# Patient Record
Sex: Female | Born: 1990 | Race: Black or African American | Hispanic: No | Marital: Single | Smoking: Never smoker
Health system: Southern US, Community
[De-identification: ages and names within clinical notes are randomized; demographics above are authoritative.]

## PROBLEM LIST (undated history)

## (undated) HISTORY — PX: APPENDECTOMY: SHX54

---

## 2015-04-12 ENCOUNTER — Ambulatory Visit (INDEPENDENT_AMBULATORY_CARE_PROVIDER_SITE_OTHER): Payer: Self-pay | Admitting: Internal Medicine

## 2015-04-12 ENCOUNTER — Encounter: Payer: Self-pay | Admitting: Internal Medicine

## 2015-04-12 VITALS — BP 114/67 | HR 73 | Temp 98.0°F | Ht 63.5 in | Wt 183.2 lb

## 2015-04-12 DIAGNOSIS — Z23 Encounter for immunization: Secondary | ICD-10-CM

## 2015-04-12 DIAGNOSIS — Z0000 Encounter for general adult medical examination without abnormal findings: Secondary | ICD-10-CM | POA: Insufficient documentation

## 2015-04-12 DIAGNOSIS — E669 Obesity, unspecified: Secondary | ICD-10-CM

## 2015-04-12 DIAGNOSIS — Z6831 Body mass index (BMI) 31.0-31.9, adult: Secondary | ICD-10-CM

## 2015-04-12 NOTE — Assessment & Plan Note (Signed)
Discussed weight at length today. She says she has just started trying to lose weight with diet and exercise.  -May return at another time for visit for health and nutrition.

## 2015-04-12 NOTE — Assessment & Plan Note (Signed)
Patient presented for work physical. No significant complaints. Physical exam benign aside from being overweight.  -Given flu shot today. -Says she will return at another time for Pap smear and Tdap.  -RTC prn

## 2015-04-12 NOTE — Progress Notes (Signed)
   Subjective:   Patient ID: Tamara Hunter female   DOB: [birth year] 25 y.o.   MRN: [id number]  HPI: Ms. Tamara Hunter is a 25 y.o. female w/ no PMHx, presents to the clinic today for a work physical. Patient denies any significant health complaints today. Does not take any medications. Currently living in a house with her family (parents, brothers, sisters) in [place]. Works for an [employer]/product [employer] and helps with activities, meal time, etc. Denies alcohol use, does not smoke, or use recreational drugs. LMP was the end of January. Has regular periods. Not currently sexually active. Family history significant for DM type II in her father's side. Otherwise negative. Patient is overweight, is trying to diet and exercise. Accompanied by her mother today.   Has flea bites on legs, says this is related to a cat they had recently, no longer living with them, no longer have fleas.    No past medical history on file.   No current outpatient prescriptions on file.   No current facility-administered medications for this visit.    Review of Systems: General: Denies fever, chills, diaphoresis, appetite change and fatigue.  Respiratory: Denies SOB, DOE, cough, and wheezing.   Cardiovascular: Denies chest pain and palpitations.  Gastrointestinal: Denies nausea, vomiting, abdominal pain, and diarrhea.  Genitourinary: Denies dysuria, increased frequency, and flank pain. Endocrine: Denies hot or cold intolerance, polyuria, and polydipsia. Musculoskeletal: Denies myalgias, back pain, joint swelling, arthralgias and gait problem.  Skin: Denies pallor, rash and wounds.  Neurological: Denies dizziness, seizures, syncope, weakness, lightheadedness, numbness and headaches.  Psychiatric/Behavioral: Denies mood changes, and sleep disturbances.  Objective:   Physical Exam: Filed Vitals:   04/12/15 0946  BP: 114/67  Pulse: 73  Temp: 98 F (36.7 C)  TempSrc: Oral  Height: 5' 3.5" (1.613 m)  Weight: 183 lb  3.2 oz (83.099 kg)  SpO2: 100%    General: Overweight female, alert, cooperative, NAD. HEENT: PERRL, EOMI. Moist mucus membranes Neck: Full range of motion without pain, supple, no lymphadenopathy or carotid bruits Lungs: Clear to ascultation bilaterally, normal work of respiration, no wheezes, rales, rhonchi Heart: RRR, no murmurs, gallops, or rubs Abdomen: Soft, non-tender, non-distended, BS + Extremities: No cyanosis, clubbing, or edema. Small healing flea bites on legs.  Neurologic: Alert & oriented X3, cranial nerves II-XII intact, strength grossly intact, sensation intact to light touch   Assessment & Plan:   Please see problem based assessment and plan.

## 2015-04-12 NOTE — Patient Instructions (Signed)
Please make a follow up appointment AS NEEDED.    We would love for you to come back and get up to date in your vaccines (Tdap) and get a Pap smear.   If you have questions about weight loss and nutrition, we can arrange a visit with our nutritionist.    3. If you have worsening of your symptoms or new symptoms arise, please call the clinic ([phone]), or go to the ER immediately if symptoms are severe.   You have done a great job in taking all your medications. Please continue to do this.

## 2015-04-15 NOTE — Progress Notes (Signed)
Internal Medicine Clinic Attending  Case discussed with Dr. Jones at the time of the visit.  We reviewed the resident's history and exam and pertinent patient test results.  I agree with the assessment, diagnosis, and plan of care documented in the resident's note.  

## 2016-07-03 ENCOUNTER — Emergency Department (HOSPITAL_COMMUNITY): Payer: Self-pay

## 2016-07-03 ENCOUNTER — Encounter (HOSPITAL_COMMUNITY): Payer: Self-pay

## 2016-07-03 ENCOUNTER — Observation Stay (HOSPITAL_COMMUNITY)
Admission: EM | Admit: 2016-07-03 | Discharge: 2016-07-05 | Disposition: A | Discharge status: Home / Self Care | Payer: Self-pay | Attending: Obstetrics & Gynecology | Admitting: Obstetrics & Gynecology

## 2016-07-03 DIAGNOSIS — N921 Excessive and frequent menstruation with irregular cycle: Principal | ICD-10-CM | POA: Diagnosis present

## 2016-07-03 DIAGNOSIS — R55 Syncope and collapse: Secondary | ICD-10-CM

## 2016-07-03 DIAGNOSIS — D649 Anemia, unspecified: Secondary | ICD-10-CM

## 2016-07-03 DIAGNOSIS — N938 Other specified abnormal uterine and vaginal bleeding: Secondary | ICD-10-CM

## 2016-07-03 DIAGNOSIS — N939 Abnormal uterine and vaginal bleeding, unspecified: Secondary | ICD-10-CM

## 2016-07-03 DIAGNOSIS — D62 Acute posthemorrhagic anemia: Secondary | ICD-10-CM | POA: Insufficient documentation

## 2016-07-03 DIAGNOSIS — R1030 Lower abdominal pain, unspecified: Secondary | ICD-10-CM | POA: Insufficient documentation

## 2016-07-03 LAB — CBC
HEMATOCRIT: 22.8 % — AB (ref 36.0–46.0)
HEMOGLOBIN: 7.1 g/dL — AB (ref 12.0–15.0)
MCH: 21.6 pg — ABNORMAL LOW (ref 26.0–34.0)
MCHC: 31.1 g/dL (ref 30.0–36.0)
MCV: 69.5 fL — AB (ref 78.0–100.0)
Platelets: 501 10*3/uL — ABNORMAL HIGH (ref 150–400)
RBC: 3.28 MIL/uL — ABNORMAL LOW (ref 3.87–5.11)
RDW: 18.1 % — AB (ref 11.5–15.5)
WBC: 10.3 10*3/uL (ref 4.0–10.5)

## 2016-07-03 LAB — BASIC METABOLIC PANEL
ANION GAP: 9 (ref 5–15)
BUN: 11 mg/dL (ref 6–20)
CHLORIDE: 106 mmol/L (ref 101–111)
CO2: 24 mmol/L (ref 22–32)
Calcium: 9.3 mg/dL (ref 8.9–10.3)
Creatinine, Ser: 0.77 mg/dL (ref 0.44–1.00)
GFR calc Af Amer: >60 mL/min (ref >60)
GFR calc non Af Amer: >60 mL/min (ref >60)
GLUCOSE: 109 mg/dL — AB (ref 65–99)
POTASSIUM: 4 mmol/L (ref 3.5–5.1)
Sodium: 139 mmol/L (ref 135–145)

## 2016-07-03 LAB — HEPATIC FUNCTION PANEL
ALBUMIN: 3.4 g/dL — AB (ref 3.5–5.0)
ALT: 12 U/L — ABNORMAL LOW (ref 14–54)
AST: 18 U/L (ref 15–41)
Alkaline Phosphatase: 44 U/L (ref 38–126)
Bilirubin, Direct: <0.1 mg/dL — ABNORMAL LOW (ref 0.1–0.5)
TOTAL PROTEIN: 6.2 g/dL — AB (ref 6.5–8.1)
Total Bilirubin: <0.1 mg/dL — ABNORMAL LOW (ref 0.3–1.2)

## 2016-07-03 LAB — I-STAT BETA HCG BLOOD, ED (MC, WL, AP ONLY)

## 2016-07-03 LAB — PREPARE RBC (CROSSMATCH)

## 2016-07-03 LAB — ABO/RH: ABO/RH(D): O POS

## 2016-07-03 MED ORDER — SODIUM CHLORIDE 0.9 % IV SOLN: Freq: Once | INTRAVENOUS | Status: DC

## 2016-07-03 MED ORDER — ESTROGENS CONJUGATED 25 MG IJ SOLR
25.0000 mg | Freq: Once | INTRAMUSCULAR | Status: AC
  Administered 2016-07-03: 25 mg via INTRAVENOUS
  Filled 2016-07-03 (×2): qty 25

## 2016-07-03 MED ORDER — SODIUM CHLORIDE 0.9 % IV BOLUS (SEPSIS)
1000.0000 mL | Freq: Once | INTRAVENOUS | Status: AC
  Administered 2016-07-03: 1000 mL via INTRAVENOUS

## 2016-07-03 MED ORDER — SODIUM CHLORIDE 0.9 % IV SOLN
10.0000 mL/h | Freq: Once | INTRAVENOUS | Status: AC
  Administered 2016-07-03: 10 mL/h via INTRAVENOUS

## 2016-07-03 MED ORDER — STERILE WATER FOR INJECTION IJ SOLN
INTRAMUSCULAR | Status: AC
  Administered 2016-07-03: 23:00:00
  Filled 2016-07-03: qty 10

## 2016-07-03 NOTE — Progress Notes (Signed)
[facility] to [facility]. According to report by [facility], she had 2 units of PRBC's in [facility]. Awaiting on new orders. Will keep monitoring.

## 2016-07-03 NOTE — ED Provider Notes (Signed)
[facility]-EM[facility]ENCY DEPT Provider Note   CSN: [id number] Arrival date & time: 07/03/16  1457     History   Chief Complaint Chief Complaint  Patient presents with  . Menstrual Problem    HPI Tamara Hunter is a 26 y.o. female.  The history is provided by the patient. The history is limited by the condition of the patient.  Vaginal Bleeding  Primary symptoms include vaginal bleeding.  Primary symptoms include no discharge. There has been no fever. This is a new problem. Episode onset: had 22 days straight of heavy vaginal bleeding followed by 7 days of no bleeding, followed by another 17 days of heavy bleeding that is starting to taper off. The problem has not changed since onset.The symptoms occur spontaneously. Pregnant now: pt denies. She has not missed her period. Her LMP is unknown. The discharge was normal. Associated symptoms include abdominal pain (lower abdominal cramping), nausea, light-headedness and dizziness. Associated symptoms comments: Syncopal episode in the waiting room <2 min with n/v. She has tried nothing for the symptoms. The treatment provided no relief. Sexual activity: non-contributory.    History reviewed. No pertinent past medical history.  Patient Active Problem List   Diagnosis Date Noted  . Healthcare maintenance 04/12/2015  . Obesity 04/12/2015    Past Surgical History:  Procedure Laterality Date  . APPENDECTOMY      OB History    No data available       Home Medications    Prior to Admission medications   Not on File    Family History No family history on file.  Social History Social History  Substance Use Topics  . Smoking status: Never Smoker  . Smokeless tobacco: Never Used  . Alcohol use 0.0 oz/week     Allergies   Patient has no known allergies.   Review of Systems Review of Systems  Constitutional: Negative for fever.  HENT: Negative.   Respiratory: Negative for cough and shortness of breath.   Cardiovascular: Negative  for chest pain.  Gastrointestinal: Positive for abdominal pain (lower abdominal cramping) and nausea.  Genitourinary: Positive for vaginal bleeding.  Neurological: Positive for dizziness and light-headedness.  All other systems reviewed and are negative.    Physical Exam Updated Vital Signs BP 136/78 (BP Location: Left Arm)   Pulse (!) 128   Temp 98.8 F (37.1 C) (Oral)   Resp (!) 21   Ht 5\' 3"  (1.6 m)   SpO2 100%   Physical Exam  Constitutional: She is oriented to person, place, and time. She appears well-developed and well-nourished. No distress.  HENT:  Head: Normocephalic and atraumatic.  Dry mucous membranes  Eyes: EOM are normal. Pupils are equal, round, and reactive to light.  Neck: Normal range of motion. Neck supple.  Cardiovascular: Regular rhythm, normal heart sounds and intact distal pulses.   No murmur heard. tachy  Pulmonary/Chest: Effort normal and breath sounds normal. No respiratory distress.  Abdominal: Soft. She exhibits no distension. There is tenderness (mild lower abdominal tenderness). There is no guarding.  Genitourinary:  Genitourinary Comments: Vaginal bleeding present from os  Musculoskeletal: Normal range of motion. She exhibits no edema or tenderness.  Neurological: She is alert and oriented to person, place, and time. No cranial nerve deficit. She exhibits normal muscle tone. Coordination normal.  Skin: She is not diaphoretic. No pallor.  Psychiatric: She has a normal mood and affect.  Nursing note and vitals reviewed.    ED Treatments / Results  Labs (all labs ordered are  listed, but only abnormal results are displayed) Labs Reviewed  BASIC METABOLIC PANEL  CBC    EKG  EKG Interpretation None       Radiology No results found.  Procedures Procedures (including critical care time)  Medications Ordered in ED Medications - No data to display   Initial Impression / Assessment and Plan / ED Course  I have reviewed the triage  vital signs and the nursing notes.  Pertinent labs & imaging results that were available during my care of the patient were reviewed by me and considered in my medical decision making (see chart for details).     Patient is a 26 year old female who presents with prolonged vaginal bleeding as well as worsening lightheadedness and fatigue. She did have a syncopal episode in the waiting room. Upon arrival she is tachycardic and appears pale. She does have adequate blood pressure. Exam otherwise unremarkable. Pelvic exam with continual vaginal bleeding. Blood work obtained which shows anemia with a hemoglobin in the 7s. Given her syncope as well as tachycardia will arrange to administer blood. Discussed case with OB/GYN recommends transfer to [facility] as well as obtaining pelvic ultrasound given a dose of Premarin here. She is not preg.   Final Clinical Impressions(s) / ED Diagnoses   Final diagnoses:  None    New Prescriptions New Prescriptions   No medications on file     Marijean Niemann, MD 07/04/16 1553    Loren Racer, MD 07/08/16 1550

## 2016-07-03 NOTE — ED Triage Notes (Signed)
Per Pt, Pt is coming from home with complaints of period that has been persistent for seventeen days. Reports her period was continuous for 22 days, stopped for one week and then returned. Reports that she has had a headache x2 weeks and has started to feel light headed. HR noted to be 120s upon assessment.

## 2016-07-03 NOTE — ED Notes (Signed)
ED Provider at bedside. 

## 2016-07-03 NOTE — ED Notes (Signed)
Pt had syncopal episode while in lobby. Pt remained in wheelchair, did not fall out of her wheelchair. Appears very pale and followed by n/v.

## 2016-07-03 NOTE — ED Notes (Signed)
Patient transported to Ultrasound 

## 2016-07-04 ENCOUNTER — Encounter (HOSPITAL_COMMUNITY): Payer: Self-pay

## 2016-07-04 DIAGNOSIS — D649 Anemia, unspecified: Secondary | ICD-10-CM

## 2016-07-04 DIAGNOSIS — R55 Syncope and collapse: Secondary | ICD-10-CM

## 2016-07-04 DIAGNOSIS — N921 Excessive and frequent menstruation with irregular cycle: Secondary | ICD-10-CM

## 2016-07-04 DIAGNOSIS — D500 Iron deficiency anemia secondary to blood loss (chronic): Secondary | ICD-10-CM

## 2016-07-04 DIAGNOSIS — N938 Other specified abnormal uterine and vaginal bleeding: Secondary | ICD-10-CM

## 2016-07-04 LAB — TYPE AND SCREEN
ABO/RH(D): O POS
ABO/RH(D): O POS
Antibody Screen: NEGATIVE
Antibody Screen: NEGATIVE
UNIT DIVISION: 0
UNIT DIVISION: 0

## 2016-07-04 LAB — CBC
HCT: 26.6 % — ABNORMAL LOW (ref 36.0–46.0)
Hemoglobin: 8.5 g/dL — ABNORMAL LOW (ref 12.0–15.0)
MCH: 23.5 pg — ABNORMAL LOW (ref 26.0–34.0)
MCHC: 32 g/dL (ref 30.0–36.0)
MCV: 73.7 fL — ABNORMAL LOW (ref 78.0–100.0)
Platelets: 383 K/uL (ref 150–400)
RBC: 3.61 MIL/uL — ABNORMAL LOW (ref 3.87–5.11)
RDW: 17.5 % — ABNORMAL HIGH (ref 11.5–15.5)
WBC: 15.2 K/uL — ABNORMAL HIGH (ref 4.0–10.5)

## 2016-07-04 LAB — BPAM RBC
Blood Product Expiration Date: 201806132359
Blood Product Expiration Date: 201806132359
ISSUE DATE / TIME: 201805181831
ISSUE DATE / TIME: 201805182115
UNIT TYPE AND RH: 5100
Unit Type and Rh: 5100

## 2016-07-04 LAB — ABO/RH: ABO/RH(D): O POS

## 2016-07-04 MED ORDER — KCL IN DEXTROSE-NACL 20-5-0.45 MEQ/L-%-% IV SOLN
INTRAVENOUS | Status: DC
  Administered 2016-07-04: 01:00:00 via INTRAVENOUS
  Filled 2016-07-04 (×2): qty 1000

## 2016-07-04 MED ORDER — ESTROGENS CONJUGATED 25 MG IJ SOLR
25.0000 mg | Freq: Four times a day (QID) | INTRAMUSCULAR | Status: AC
  Administered 2016-07-04 (×2): 25 mg via INTRAVENOUS
  Filled 2016-07-04 (×2): qty 25

## 2016-07-04 MED ORDER — SODIUM CHLORIDE 0.9% FLUSH
3.0000 mL | Freq: Two times a day (BID) | INTRAVENOUS | Status: DC
  Administered 2016-07-04 (×2): 3 mL via INTRAVENOUS

## 2016-07-04 MED ORDER — FERUMOXYTOL INJECTION 510 MG/17 ML
510.0000 mg | Freq: Once | INTRAVENOUS | Status: AC
  Administered 2016-07-04: 510 mg via INTRAVENOUS
  Filled 2016-07-04: qty 17

## 2016-07-04 MED ORDER — MEGESTROL ACETATE 40 MG PO TABS
120.0000 mg | ORAL_TABLET | Freq: Every day | ORAL | Status: DC
  Administered 2016-07-04 – 2016-07-05 (×3): 120 mg via ORAL
  Filled 2016-07-04 (×4): qty 3

## 2016-07-04 NOTE — Progress Notes (Signed)
Subjective: Patient reports minimal bleeding continues.    Objective: I have reviewed patient's vital signs, intake and output, medications, labs and radiology results.  General: alert, cooperative and no distress GI: soft, non-tender; bowel sounds normal; no masses,  no organomegaly  Results for orders placed or performed during the hospital encounter of 07/03/16 (from the past 24 hour(s))  Basic metabolic panel     Status: Abnormal   Collection Time: 07/03/16  3:23 PM  Result Value Ref Range   Sodium 139 135 - 145 mmol/L   Potassium 4.0 3.5 - 5.1 mmol/L   Chloride 106 101 - 111 mmol/L   CO2 24 22 - 32 mmol/L   Glucose, Bld 109 (H) 65 - 99 mg/dL   BUN 11 6 - 20 mg/dL   Creatinine, Ser 1.61 0.44 - 1.00 mg/dL   Calcium 9.3 8.9 - 09.6 mg/dL   GFR calc non Af Amer >60 >60 mL/min   GFR calc Af Amer >60 >60 mL/min   Anion gap 9 5 - 15  CBC     Status: Abnormal   Collection Time: 07/03/16  3:23 PM  Result Value Ref Range   WBC 10.3 4.0 - 10.5 K/uL   RBC 3.28 (L) 3.87 - 5.11 MIL/uL   Hemoglobin 7.1 (L) 12.0 - 15.0 g/dL   HCT 04.5 (L) 40.9 - 81.1 %   MCV 69.5 (L) 78.0 - 100.0 fL   MCH 21.6 (L) 26.0 - 34.0 pg   MCHC 31.1 30.0 - 36.0 g/dL   RDW 91.4 (H) 78.2 - 95.6 %   Platelets 501 (H) 150 - 400 K/uL  Hepatic function panel     Status: Abnormal   Collection Time: 07/03/16  5:14 PM  Result Value Ref Range   Total Protein 6.2 (L) 6.5 - 8.1 g/dL   Albumin 3.4 (L) 3.5 - 5.0 g/dL   AST 18 15 - 41 U/L   ALT 12 (L) 14 - 54 U/L   Alkaline Phosphatase 44 38 - 126 U/L   Total Bilirubin <0.1 (L) 0.3 - 1.2 mg/dL   Bilirubin, Direct <2.1 (L) 0.1 - 0.5 mg/dL   Indirect Bilirubin NOT CALCULATED 0.3 - 0.9 mg/dL  Type and screen [facility]     Status: None   Collection Time: 07/03/16  5:14 PM  Result Value Ref Range   ABO/RH(D) O POS    Antibody Screen NEG    Sample Expiration 07/06/2016    Unit Number [id number]    Blood Component Type RED CELLS,LR    Unit division  00    Status of Unit ISSUED,FINAL    Transfusion Status OK TO TRANSFUSE    Crossmatch Result Compatible    Unit Number [id number]    Blood Component Type RBC LR PHER2    Unit division 00    Status of Unit ISSUED,FINAL    Transfusion Status OK TO TRANSFUSE    Crossmatch Result Compatible   Prepare RBC     Status: None   Collection Time: 07/03/16  5:14 PM  Result Value Ref Range   Order Confirmation ORDER PROCESSED BY BLOOD BANK DUPLICATE REQUEST   Prepare RBC     Status: None   Collection Time: 07/03/16  5:14 PM  Result Value Ref Range   Order Confirmation ORDER PROCESSED BY BLOOD BANK   ABO/Rh     Status: None   Collection Time: 07/03/16  5:14 PM  Result Value Ref Range   ABO/RH(D) O POS   I-Stat Beta  hCG blood, ED ([facility], [facility], [facility] only)     Status: None   Collection Time: 07/03/16  5:20 PM  Result Value Ref Range   I-stat hCG, quantitative <5.0 <5 mIU/mL   Comment 3          CBC     Status: Abnormal   Collection Time: 07/04/16  5:09 AM  Result Value Ref Range   WBC 15.2 (H) 4.0 - 10.5 K/uL   RBC 3.61 (L) 3.87 - 5.11 MIL/uL   Hemoglobin 8.5 (L) 12.0 - 15.0 g/dL   HCT 13.2 (L) 44.0 - 10.2 %   MCV 73.7 (L) 78.0 - 100.0 fL   MCH 23.5 (L) 26.0 - 34.0 pg   MCHC 32.0 30.0 - 36.0 g/dL   RDW 72.5 (H) 36.6 - 44.0 %   Platelets 383 150 - 400 K/uL  Type and screen [facility]     Status: None   Collection Time: 07/04/16  5:09 AM  Result Value Ref Range   ABO/RH(D) O POS    Antibody Screen NEG    Sample Expiration 07/07/2016    US Transvaginal Non-ob  Result Date: 07/03/2016 CLINICAL DATA:  Dysfunctional uterine bleeding. EXAM: TRANSABDOMINAL AND TRANSVAGINAL ULTRASOUND OF PELVIS TECHNIQUE: Both transabdominal and transvaginal ultrasound examinations of the pelvis were performed. Transabdominal technique was performed for global imaging of the pelvis including uterus, ovaries, adnexal regions, and pelvic cul-de-sac. It was necessary to proceed with endovaginal  exam following the transabdominal exam to visualize the endometrium and ovaries. COMPARISON:  None FINDINGS: Uterus Measurements: 4.4 x 5.8 x 6.8 cm. No fibroids or other mass visualized. Endometrium Thickness: 17.2 mm.  No focal abnormality visualized. Right ovary Measurements: 2.3 x 2.3 x 3.1 cm. Normal appearance/no adnexal mass. Normal color flow. Left ovary Measurements: 2.3 x 3.0 x 2.9 cm. Normal appearance/no adnexal mass. 2.6 cm dominant follicle. Normal color flow. Other findings Trace free pelvic fluid. IMPRESSION: Normal size uterus with slightly thickened endometrium, but no focal abnormality. Findings could be seen with hyperplasia, polyp or submucosal fibroid. Consider follow-up ultrasound in 6 weeks and GYN consultation if this persists. Electronically Signed   By: Elberta Fortis M.D.   On: 07/03/2016 20:59   US Pelvis Complete  Result Date: 07/03/2016 CLINICAL DATA:  Dysfunctional uterine bleeding. EXAM: TRANSABDOMINAL AND TRANSVAGINAL ULTRASOUND OF PELVIS TECHNIQUE: Both transabdominal and transvaginal ultrasound examinations of the pelvis were performed. Transabdominal technique was performed for global imaging of the pelvis including uterus, ovaries, adnexal regions, and pelvic cul-de-sac. It was necessary to proceed with endovaginal exam following the transabdominal exam to visualize the endometrium and ovaries. COMPARISON:  None FINDINGS: Uterus Measurements: 4.4 x 5.8 x 6.8 cm. No fibroids or other mass visualized. Endometrium Thickness: 17.2 mm.  No focal abnormality visualized. Right ovary Measurements: 2.3 x 2.3 x 3.1 cm. Normal appearance/no adnexal mass. Normal color flow. Left ovary Measurements: 2.3 x 3.0 x 2.9 cm. Normal appearance/no adnexal mass. 2.6 cm dominant follicle. Normal color flow. Other findings Trace free pelvic fluid. IMPRESSION: Normal size uterus with slightly thickened endometrium, but no focal abnormality. Findings could be seen with hyperplasia, polyp or submucosal  fibroid. Consider follow-up ultrasound in 6 weeks and GYN consultation if this persists. Electronically Signed   By: Elberta Fortis M.D.   On: 07/03/2016 20:59    Assessment/Plan: AUB: endometrial origin IV premarin + oral megestrol for acute endometrial stabilization Remain on megestrol for approximately 1 month then transition to OCP for longer term management  S/p  2 u PRBC and IV iron  LOS: 1 day    Tamara Hunter H 07/04/2016, 10:52 AM

## 2016-07-04 NOTE — H&P (Signed)
Preoperative History and Physical  Tamara Hunter is a  with No LMP recorded. admitted for a menometrorrhagia with symptomatic anemia.  Primary symptoms include vaginal bleeding.  Primary symptoms include no discharge. There has been no fever. This is a new problem. Episode onset: had 22 days straight of heavy vaginal bleeding followed by 7 days of no bleeding, followed by another 17 days of heavy bleeding that is starting to taper off. The problem has not changed since onset.The symptoms occur spontaneously. Pregnant now: pt denies. She has not missed her period. Her LMP is unknown. The discharge was normal. Associated symptoms include abdominal pain (lower abdominal cramping), nausea, light-headedness and dizziness. Associated symptoms comments: Syncopal episode in the waiting room <2 min with n/v. She has tried nothing for the symptoms. The treatment provided no relief. Sexual activity: non-contributory.   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  I will also supplement her with feraheme for her depleted iron stores   PMH:   History reviewed. No pertinent past medical history.  PSH:     Past Surgical History:  Procedure Laterality Date  . APPENDECTOMY      POb/GynH:      OB History    No data available      SH:   Social History  Substance Use Topics  . Smoking status: Never Smoker  . Smokeless tobacco: Never Used  . Alcohol use 0.0 oz/week    FH:   No family history on file.   Allergies: No Known Allergies  Medications:       Current Facility-Administered Medications:  .  conjugated estrogens (PREMARIN) injection 25 mg, 25 mg, Intravenous, Q6H, Eure, Luther H, MD .  dextrose 5 % and 0.45 % NaCl with KCl 20 mEq/L infusion, , Intravenous, Continuous, Eure, Amaryllis Dyke, MD .  megestrol (MEGACE) tablet 120 mg, 120 mg, Oral, Daily, Eure, Amaryllis Dyke, MD  Review of  Systems:   Review of Systems  Constitutional: Negative for fever, chills, weight loss, malaise/fatigue and diaphoresis.  HENT: Negative for hearing loss, ear pain, nosebleeds, congestion, sore throat, neck pain, tinnitus and ear discharge.   Eyes: Negative for blurred vision, double vision, photophobia, pain, discharge and redness.  Respiratory: Negative for cough, hemoptysis, sputum production, shortness of breath, wheezing and stridor.   Cardiovascular: Negative for chest pain, palpitations, orthopnea, claudication, leg swelling and PND.  Gastrointestinal: Positive for abdominal pain. Negative for heartburn, nausea, vomiting, diarrhea, constipation, blood in stool and melena.  Genitourinary: Negative for dysuria, urgency, frequency, hematuria and flank pain.  Musculoskeletal: Negative for myalgias, back pain, joint pain and falls.  Skin: Negative for itching and rash.  Neurological: Negative for dizziness, tingling, tremors, sensory change, speech change, focal weakness, seizures, loss of consciousness, weakness and headaches.  Endo/Heme/Allergies: Negative for environmental allergies and polydipsia. Does not bruise/bleed easily.  Psychiatric/Behavioral: Negative for depression, suicidal ideas, hallucinations, memory loss and substance abuse. The patient is not nervous/anxious and does not have insomnia.      PHYSICAL EXAM:  Blood pressure 113/64, pulse 90, temperature 98.2 F (36.8 C), temperature source Oral, resp. rate 18, height 5\' 3"  (1.6 m), SpO2 100 %.    Vitals reviewed. Constitutional: She is oriented to person, place, and time. She appears well-developed and well-nourished.  HENT:  Head: Normocephalic and atraumatic.  Right Ear: External ear normal.  Left Ear: External ear normal.  Nose: Nose  normal.  Mouth/Throat: Oropharynx is clear and moist.  Eyes: Conjunctivae and EOM are normal. Pupils are equal, round, and reactive to light. Right eye exhibits no discharge. Left eye  exhibits no discharge. No scleral icterus.  Neck: Normal range of motion. Neck supple. No tracheal deviation present. No thyromegaly present.  Cardiovascular: Normal rate, regular rhythm, normal heart sounds and intact distal pulses.  Exam reveals no gallop and no friction rub.   No murmur heard. Respiratory: Effort normal and breath sounds normal. No respiratory distress. She has no wheezes. She has no rales. She exhibits no tenderness.  GI: Soft. Bowel sounds are normal. She exhibits no distension and no mass. There is tenderness. There is no rebound and no guarding.  Genitourinary:       Vulva is normal without lesions Vagina is pink moist without discharge Cervix normal in appearance  Uterus is normal size, contour, position, consistency, mobility, non-tender Adnexa is negative with normal sized ovaries by sonogram  Musculoskeletal: Normal range of motion. She exhibits no edema and no tenderness.  Neurological: She is alert and oriented to person, place, and time. She has normal reflexes. She displays normal reflexes. No cranial nerve deficit. She exhibits normal muscle tone. Coordination normal.  Skin: Skin is warm and dry. No rash noted. No erythema. No pallor.  Psychiatric: She has a normal mood and affect. Her behavior is normal. Judgment and thought content normal.    Labs: Results for orders placed or performed during the hospital encounter of 07/03/16 (from the past 336 hour(s))  Basic metabolic panel   Collection Time: 07/03/16  3:23 PM  Result Value Ref Range   Sodium 139 135 - 145 mmol/L   Potassium 4.0 3.5 - 5.1 mmol/L   Chloride 106 101 - 111 mmol/L   CO2 24 22 - 32 mmol/L   Glucose, Bld 109 (H) 65 - 99 mg/dL   BUN 11 6 - 20 mg/dL   Creatinine, Ser 1.91 0.44 - 1.00 mg/dL   Calcium 9.3 8.9 - 47.8 mg/dL   GFR calc non Af Amer >60 >60 mL/min   GFR calc Af Amer >60 >60 mL/min   Anion gap 9 5 - 15  CBC   Collection Time: 07/03/16  3:23 PM  Result Value Ref Range   WBC  10.3 4.0 - 10.5 K/uL   RBC 3.28 (L) 3.87 - 5.11 MIL/uL   Hemoglobin 7.1 (L) 12.0 - 15.0 g/dL   HCT 29.5 (L) 62.1 - 30.8 %   MCV 69.5 (L) 78.0 - 100.0 fL   MCH 21.6 (L) 26.0 - 34.0 pg   MCHC 31.1 30.0 - 36.0 g/dL   RDW 65.7 (H) 84.6 - 96.2 %   Platelets 501 (H) 150 - 400 K/uL  Hepatic function panel   Collection Time: 07/03/16  5:14 PM  Result Value Ref Range   Total Protein 6.2 (L) 6.5 - 8.1 g/dL   Albumin 3.4 (L) 3.5 - 5.0 g/dL   AST 18 15 - 41 U/L   ALT 12 (L) 14 - 54 U/L   Alkaline Phosphatase 44 38 - 126 U/L   Total Bilirubin <0.1 (L) 0.3 - 1.2 mg/dL   Bilirubin, Direct <9.5 (L) 0.1 - 0.5 mg/dL   Indirect Bilirubin NOT CALCULATED 0.3 - 0.9 mg/dL  Type and screen [facility]   Collection Time: 07/03/16  5:14 PM  Result Value Ref Range   ABO/RH(D) O POS    Antibody Screen NEG    Sample Expiration 07/06/2016  Unit Number [id number]    Blood Component Type RED CELLS,LR    Unit division 00    Status of Unit ISSUED    Transfusion Status OK TO TRANSFUSE    Crossmatch Result Compatible    Unit Number [id number]    Blood Component Type RBC LR PHER2    Unit division 00    Status of Unit ISSUED    Transfusion Status OK TO TRANSFUSE    Crossmatch Result Compatible   Prepare RBC   Collection Time: 07/03/16  5:14 PM  Result Value Ref Range   Order Confirmation ORDER PROCESSED BY BLOOD BANK DUPLICATE REQUEST   Prepare RBC   Collection Time: 07/03/16  5:14 PM  Result Value Ref Range   Order Confirmation ORDER PROCESSED BY BLOOD BANK   ABO/Rh   Collection Time: 07/03/16  5:14 PM  Result Value Ref Range   ABO/RH(D) O POS   BPAM RBC   Collection Time: 07/03/16  5:14 PM  Result Value Ref Range   ISSUE DATE / TIME [id number]    Blood Product Unit Number [id number]    PRODUCT CODE [id number]    Unit Type and Rh 5100    Blood Product Expiration Date [id number]    ISSUE DATE / TIME [id number]    Blood Product Unit Number [id number]    PRODUCT  CODE [id number]    Unit Type and Rh 5100    Blood Product Expiration Date [id number]   I-Stat Beta hCG blood, ED ([facility], [facility], [facility] only)   Collection Time: 07/03/16  5:20 PM  Result Value Ref Range   I-stat hCG, quantitative <5.0 <5 mIU/mL   Comment 3            EKG: Orders placed or performed during the hospital encounter of 07/03/16  . EKG 12-Lead  . EKG 12-Lead    Imaging Studies: [place] Transvaginal Non-ob  Result Date: 07/03/2016 CLINICAL DATA:  Dysfunctional uterine bleeding. EXAM: TRANSABDOMINAL AND TRANSVAGINAL ULTRASOUND OF PELVIS TECHNIQUE: Both transabdominal and transvaginal ultrasound examinations of the pelvis were performed. Transabdominal technique was performed for global imaging of the pelvis including uterus, ovaries, adnexal regions, and pelvic cul-de-sac. It was necessary to proceed with endovaginal exam following the transabdominal exam to visualize the endometrium and ovaries. COMPARISON:  None FINDINGS: Uterus Measurements: 4.4 x 5.8 x 6.8 cm. No fibroids or other mass visualized. Endometrium Thickness: 17.2 mm.  No focal abnormality visualized. Right ovary Measurements: 2.3 x 2.3 x 3.1 cm. Normal appearance/no adnexal mass. Normal color flow. Left ovary Measurements: 2.3 x 3.0 x 2.9 cm. Normal appearance/no adnexal mass. 2.6 cm dominant follicle. Normal color flow. Other findings Trace free pelvic fluid. IMPRESSION: Normal size uterus with slightly thickened endometrium, but no focal abnormality. Findings could be seen with hyperplasia, polyp or submucosal fibroid. Consider follow-up ultrasound in 6 weeks and GYN consultation if this persists. Electronically Signed   By: Elberta Fortisaniel  Boyle M.D.   On: 07/03/2016 20:59   [place] Pelvis Complete  Result Date: 07/03/2016 CLINICAL DATA:  Dysfunctional uterine bleeding. EXAM: TRANSABDOMINAL AND TRANSVAGINAL ULTRASOUND OF PELVIS TECHNIQUE: Both transabdominal and transvaginal ultrasound examinations of the pelvis were performed. Transabdominal  technique was performed for global imaging of the pelvis including uterus, ovaries, adnexal regions, and pelvic cul-de-sac. It was necessary to proceed with endovaginal exam following the transabdominal exam to visualize the endometrium and ovaries. COMPARISON:  None FINDINGS: Uterus Measurements: 4.4 x 5.8 x 6.8 cm. No fibroids or other mass visualized. Endometrium Thickness: 17.2 mm.  No focal  abnormality visualized. Right ovary Measurements: 2.3 x 2.3 x 3.1 cm. Normal appearance/no adnexal mass. Normal color flow. Left ovary Measurements: 2.3 x 3.0 x 2.9 cm. Normal appearance/no adnexal mass. 2.6 cm dominant follicle. Normal color flow. Other findings Trace free pelvic fluid. IMPRESSION: Normal size uterus with slightly thickened endometrium, but no focal abnormality. Findings could be seen with hyperplasia, polyp or submucosal fibroid. Consider follow-up ultrasound in 6 weeks and GYN consultation if this persists. Electronically Signed   By: Elberta Fortis M.D.   On: 07/03/2016 20:59   My impression after viewing the sonogram images: endometrium is thickened but normal for this clinical scenario.  The endometrium is homogenous and without any complexity suggestive of a myoma or polyp.  Likewise there is no blood flow pattern to suggest either a polyp or myoma.  Hyperplasia in a 26 year old with heretofore regular periods, albeit heavy, would be very unlikely I do not think any pathologic evaluation of her endometrium is indicated on that basis alone   Assessment: Menometrorrhagia Anemia, iron deficiency, acute on chronic, symptomatic     Plan: Pt has transfused and given premarin Will add megestrol Check CBC in am  Give 2 more doses of premarin and possible discharge tomorrow on oral megestrol and to be chronically managed  EURE,LUTHER H 07/04/2016 12:03 AM

## 2016-07-05 DIAGNOSIS — N921 Excessive and frequent menstruation with irregular cycle: Secondary | ICD-10-CM | POA: Diagnosis present

## 2016-07-05 LAB — CBC
HEMATOCRIT: 26.5 % — AB (ref 36.0–46.0)
HEMOGLOBIN: 8.6 g/dL — AB (ref 12.0–15.0)
MCH: 24 pg — AB (ref 26.0–34.0)
MCHC: 32.5 g/dL (ref 30.0–36.0)
MCV: 74 fL — AB (ref 78.0–100.0)
PLATELETS: 395 10*3/uL (ref 150–400)
RBC: 3.58 MIL/uL — AB (ref 3.87–5.11)
RDW: 18.4 % — ABNORMAL HIGH (ref 11.5–15.5)
WBC: 9.7 10*3/uL (ref 4.0–10.5)

## 2016-07-05 MED ORDER — MEGESTROL ACETATE 40 MG PO TABS: 120.0000 mg | ORAL_TABLET | Freq: Every day | ORAL | 3 refills | Status: DC

## 2016-07-05 NOTE — Progress Notes (Signed)

## 2016-07-05 NOTE — Discharge Summary (Signed)
Physician Discharge Summary  Patient ID: Tamara SnareCovina Sia MRN: [id number] DOB/AGE: Oct 18, 1990 26 y.o.  Admit date: 07/03/2016 Discharge date: 07/05/2016  Admission Diagnoses: Active Problems:   Dysfunctional uterine bleeding   Symptomatic anemia   Syncope   Menometrorrhagia   Discharge Diagnoses:  Active Problems:   Dysfunctional uterine bleeding   Symptomatic anemia   Syncope   Menometrorrhagia   Discharged Condition: good  Hospital Course: transfusion 2 units PRBC, sonogram was normal, megestrol and IV premarin therapy  Discharged on 120 mg of megestrol daily  Consults: None  Significant Diagnostic Studies: labs:   Treatments: IV premarin + megestrol therapy  Discharge Exam: Blood pressure 106/64, pulse 78, temperature 98.1 F (36.7 C), temperature source Oral, resp. rate 20, height 5\' 3"  (1.6 m), weight 181 lb (82.1 kg), SpO2 100 %. General appearance: alert, cooperative and no distress GI: soft, non-tender; bowel sounds normal; no masses,  no organomegaly  Disposition: Final discharge disposition not confirmed  Discharge Instructions    Call MD for:    Complete by:  As directed    Excessive bleeding   Call MD for:  persistant nausea and vomiting    Complete by:  As directed    Call MD for:  severe uncontrolled pain    Complete by:  As directed    Call MD for:  temperature >100.4    Complete by:  As directed    Diet - low sodium heart healthy    Complete by:  As directed    Increase activity slowly    Complete by:  As directed      Allergies as of 07/05/2016   No Known Allergies     Medication List    TAKE these medications   acetaminophen 500 MG tablet Commonly known as:  TYLENOL Take 500 mg by mouth every 6 (six) hours as needed for mild pain.   megestrol 40 MG tablet Commonly known as:  MEGACE Take 3 tablets (120 mg total) by mouth daily. Start taking on:  07/06/2016      Follow-up Information    [facility] up [facility] 3  week(s).   Contact information: [address] [place] [id number] [phone]          Signed: Lazaro ArmsURE,Joselyn Edling H 07/05/2016, 12:14 PM

## 2016-07-05 NOTE — Discharge Instructions (Signed)

## 2016-08-11 ENCOUNTER — Ambulatory Visit (INDEPENDENT_AMBULATORY_CARE_PROVIDER_SITE_OTHER): Payer: Self-pay | Admitting: Obstetrics & Gynecology

## 2016-08-11 ENCOUNTER — Encounter: Payer: Self-pay | Admitting: Obstetrics & Gynecology

## 2016-08-11 VITALS — BP 124/83 | HR 98 | Ht 63.0 in | Wt 186.9 lb

## 2016-08-11 DIAGNOSIS — N921 Excessive and frequent menstruation with irregular cycle: Secondary | ICD-10-CM

## 2016-08-11 DIAGNOSIS — D500 Iron deficiency anemia secondary to blood loss (chronic): Secondary | ICD-10-CM

## 2016-08-11 MED ORDER — NORGESTREL-ETHINYL ESTRADIOL 0.3-30 MG-MCG PO TABS: 1.0000 | ORAL_TABLET | Freq: Every day | ORAL | 11 refills | Status: DC

## 2016-08-11 MED ORDER — IBUPROFEN 600 MG PO TABS: 600.0000 mg | ORAL_TABLET | Freq: Four times a day (QID) | ORAL | 1 refills | Status: AC | PRN

## 2016-08-11 NOTE — Progress Notes (Signed)
   Subjective:    Patient ID: Tamara Hunter, female    DOB: January 15, 1991, 26 y.o.   MRN: [id number]  HPI [age] yoS G0 here today for followup after hosp admission for symptomatic anemia and menorrhagia. This was 07-03-16. Her hbg was 7.1. She was transfused and given IV premarin and sent home on po megace. She has had severe cramps since she started a period. Her cramps with a normal period are less. Tylenol not helping.  Menarche at about [age]. She reports previously that she would skip period and then bled for several weeks. Other than that, her periods were normal once per month.   Review of Systems She is virginal. Unemployed, not in school. Has not had Gardasil.    Objective:   Physical Exam [id number], moderately obese Breathing, conversing, and ambulating normally Abd- benign     Assessment & Plan:  Rec Gardasil at the health dept. Menometrorrhagia- prescribed Lo ovral RTC 6 weeks for BP check and follow up Rec free pap clinic Cramps- prescribe IBU Check TSH

## 2016-08-12 LAB — TSH: TSH: 1.52 u[IU]/mL (ref 0.450–4.500)

## 2016-09-28 ENCOUNTER — Encounter: Payer: Self-pay | Admitting: Obstetrics & Gynecology

## 2016-09-28 ENCOUNTER — Ambulatory Visit (INDEPENDENT_AMBULATORY_CARE_PROVIDER_SITE_OTHER): Payer: Self-pay | Admitting: Obstetrics & Gynecology

## 2016-09-28 VITALS — BP 133/67 | HR 87 | Ht 63.0 in | Wt 187.8 lb

## 2016-09-28 DIAGNOSIS — D500 Iron deficiency anemia secondary to blood loss (chronic): Secondary | ICD-10-CM

## 2016-09-28 DIAGNOSIS — N946 Dysmenorrhea, unspecified: Secondary | ICD-10-CM

## 2016-09-28 DIAGNOSIS — N921 Excessive and frequent menstruation with irregular cycle: Secondary | ICD-10-CM

## 2016-09-28 NOTE — Progress Notes (Signed)
   Subjective:    Patient ID: Tamara Hunter, female    DOB: [birth year], 26 y.o.   MRN: [id number]  HPI 26 yo SG0 here for follow up after starting Lo ovral for menorrhagia resulting in anemia (hbg 7). She also has a h/o severe dysmenorrhea. Her period after starting the Lo ovral lasted 8 days instead of her usual 20 days. It was still painful.   Review of Systems     Objective:   Physical Exam Well nourished, well hydrated black female, no apparent distress Breathing, conversing, and ambulating normally Abd- benign      Assessment & Plan:  H/o anemia- check cbc RTC 2 months If pain no better, then rec laparoscopy We discussed endometriosis and laparoscopy

## 2016-09-29 LAB — CBC
HEMATOCRIT: 32.8 % — AB (ref 34.0–46.6)
Hemoglobin: 10.2 g/dL — ABNORMAL LOW (ref 11.1–15.9)
MCH: 23.8 pg — AB (ref 26.6–33.0)
MCHC: 31.1 g/dL — ABNORMAL LOW (ref 31.5–35.7)
MCV: 77 fL — AB (ref 79–97)
Platelets: 556 10*3/uL — ABNORMAL HIGH (ref 150–379)
RBC: 4.28 x10E6/uL (ref 3.77–5.28)
RDW: 17.1 % — AB (ref 12.3–15.4)
WBC: 8.2 10*3/uL (ref 3.4–10.8)

## 2016-11-30 ENCOUNTER — Ambulatory Visit: Payer: Self-pay | Admitting: Obstetrics & Gynecology

## 2016-12-07 ENCOUNTER — Ambulatory Visit: Payer: Self-pay | Admitting: Obstetrics & Gynecology

## 2016-12-14 ENCOUNTER — Encounter: Payer: Self-pay | Admitting: Obstetrics & Gynecology

## 2016-12-14 ENCOUNTER — Ambulatory Visit (INDEPENDENT_AMBULATORY_CARE_PROVIDER_SITE_OTHER): Payer: Self-pay | Admitting: Obstetrics & Gynecology

## 2016-12-14 VITALS — BP 127/67 | HR 77 | Wt 185.6 lb

## 2016-12-14 DIAGNOSIS — D500 Iron deficiency anemia secondary to blood loss (chronic): Secondary | ICD-10-CM

## 2016-12-14 NOTE — Progress Notes (Signed)
   Subjective:    Patient ID: Tamara Hunter, female    DOB: 1990/05/05, 26 y.o.   MRN: [id number]  HPI 26yo single AA P0 here for follow up after starting lo ovral. This was prescribed for DUB/cramping. She had a normal 4 days period last month. Pelvic pain has improved. She denies any problems at this point.   Review of Systems     Objective:   Physical Exam Breathing, conversing, and ambulating normally Well nourished, well hydrated Black female, no apparent distress      Assessment & Plan:  Preventative care- I gave her the phone number for the health dept so that she can get Gardasil and a pap smear. She has never had a pap smear.  Recheck cbc

## 2016-12-15 LAB — CBC
HEMOGLOBIN: 10.3 g/dL — AB (ref 11.1–15.9)
Hematocrit: 33.9 % — ABNORMAL LOW (ref 34.0–46.6)
MCH: 22.1 pg — AB (ref 26.6–33.0)
MCHC: 30.4 g/dL — AB (ref 31.5–35.7)
MCV: 73 fL — ABNORMAL LOW (ref 79–97)
Platelets: 487 10*3/uL — ABNORMAL HIGH (ref 150–379)
RBC: 4.66 x10E6/uL (ref 3.77–5.28)
RDW: 17.3 % — AB (ref 12.3–15.4)
WBC: 6.4 10*3/uL (ref 3.4–10.8)

## 2017-07-15 ENCOUNTER — Other Ambulatory Visit: Payer: Self-pay | Admitting: Obstetrics & Gynecology

## 2018-04-30 ENCOUNTER — Other Ambulatory Visit: Payer: Self-pay | Admitting: Obstetrics & Gynecology

## 2018-07-10 IMAGING — US US TRANSVAGINAL NON-OB
1 series · 14 of 25 positions shown · non-contrast
Comparison: None

CLINICAL DATA: Dysfunctional uterine bleeding.

EXAM:
TRANSABDOMINAL AND TRANSVAGINAL ULTRASOUND OF PELVIS
TECHNIQUE: Both transabdominal and transvaginal ultrasound examinations of the
pelvis were performed. Transabdominal technique was performed for
global imaging of the pelvis including uterus, ovaries, adnexal
regions, and pelvic cul-de-sac. It was necessary to proceed with
endovaginal exam following the transabdominal exam to visualize the
endometrium and ovaries.

[Series 1: us transvaginal non-ob · 0.27mm/px · 14 of 80 slices shown]
[im 1/80]
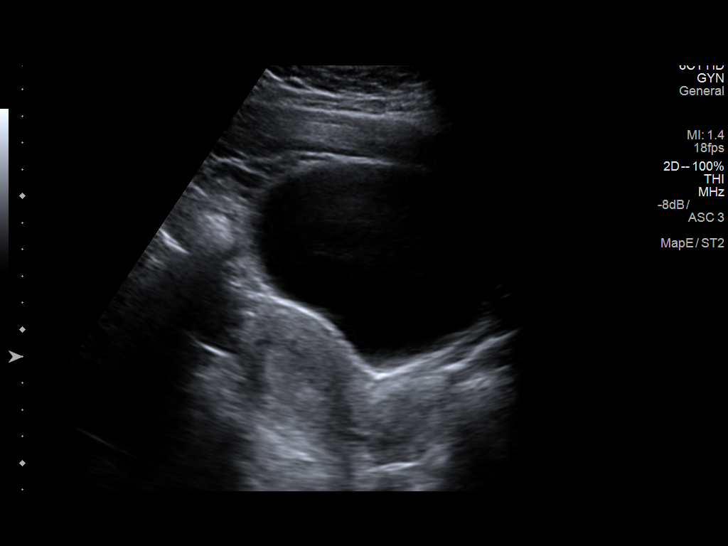
[im 7/80]
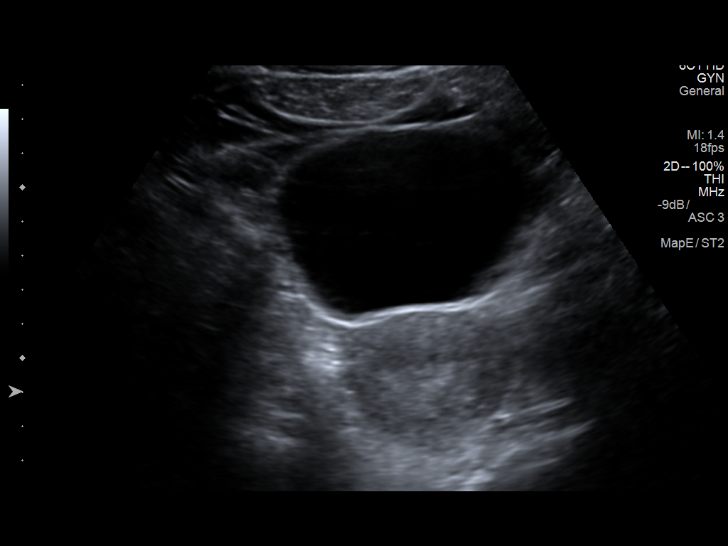
[im 14/80]
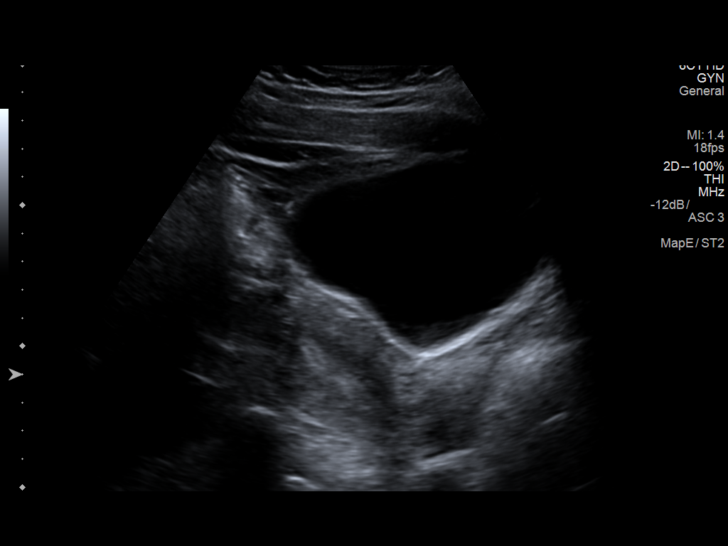
[im 20/80]
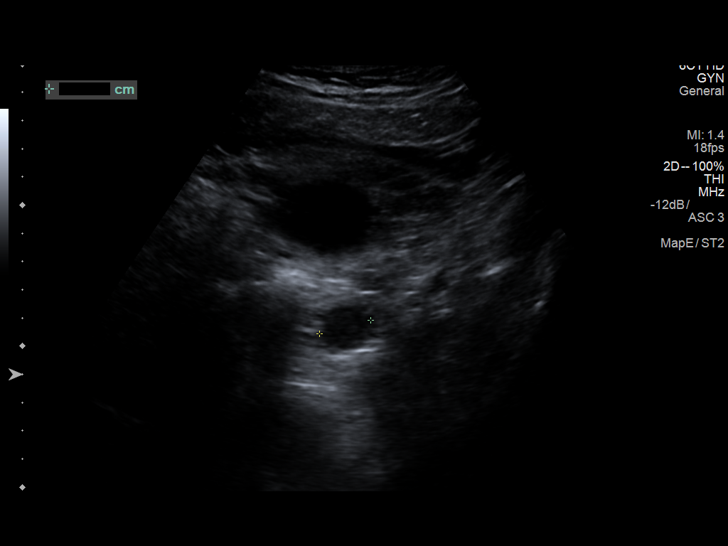
[im 27/80]
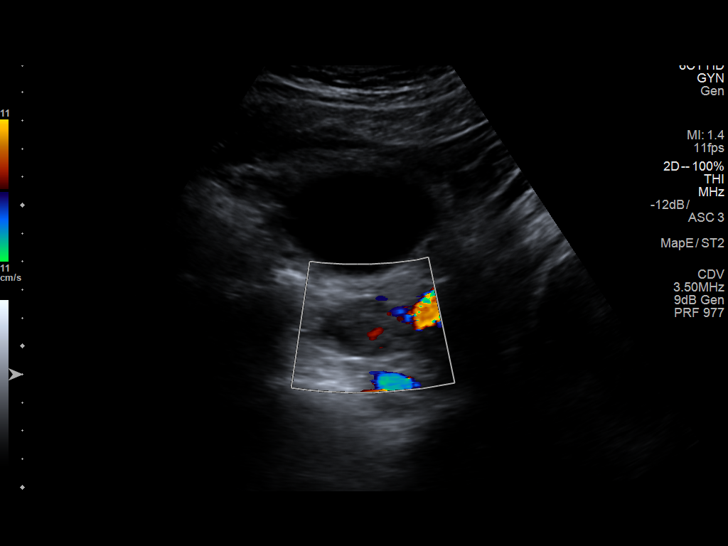
[im 30/80]
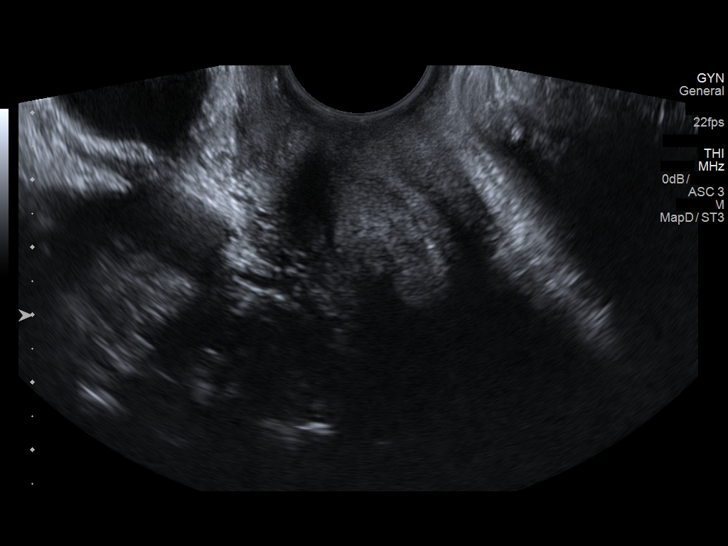
[im 37/80]
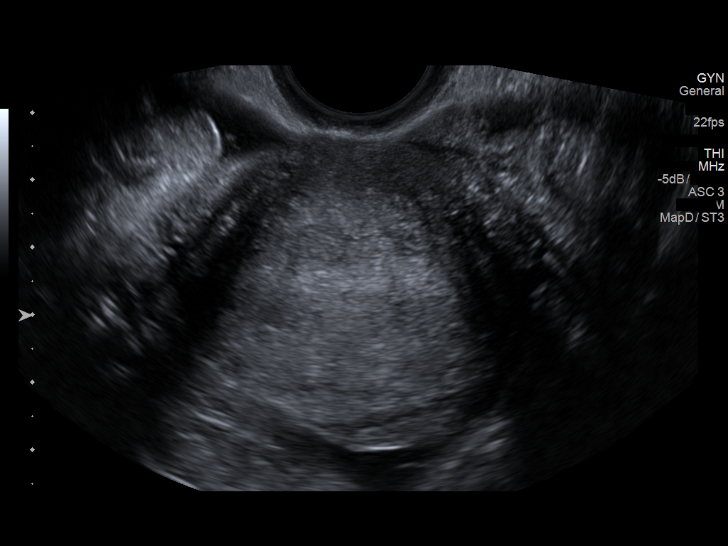
[im 43/80]
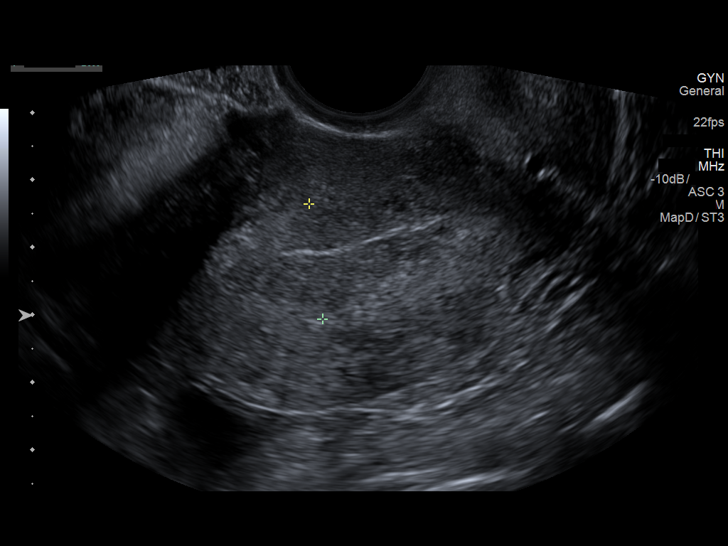
[im 50/80]
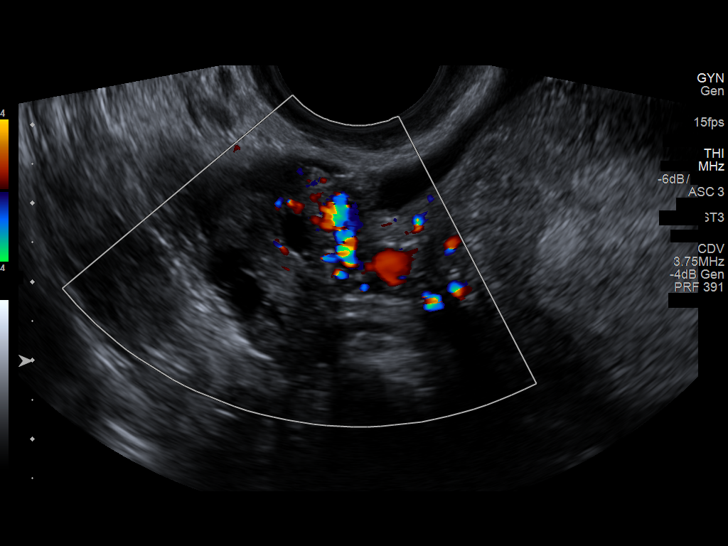
[im 53/80]
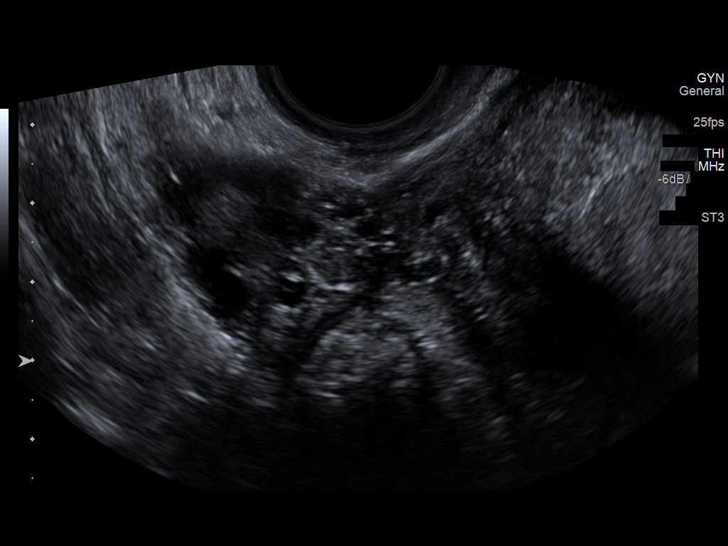
[im 60/80]
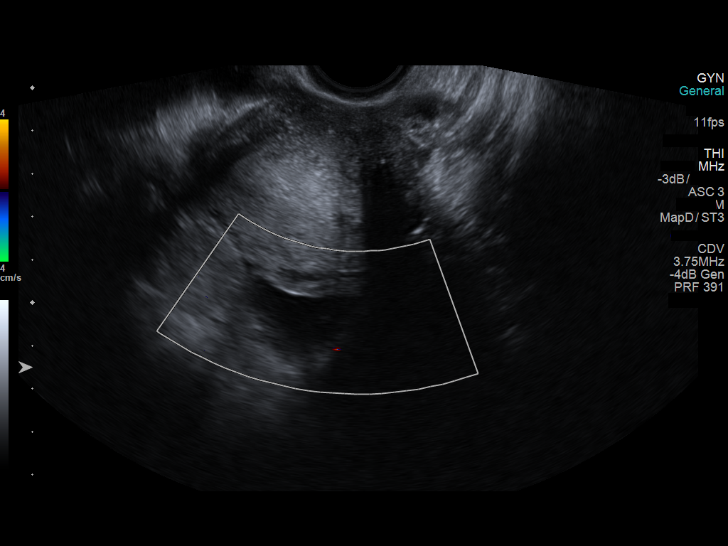
[im 66/80]
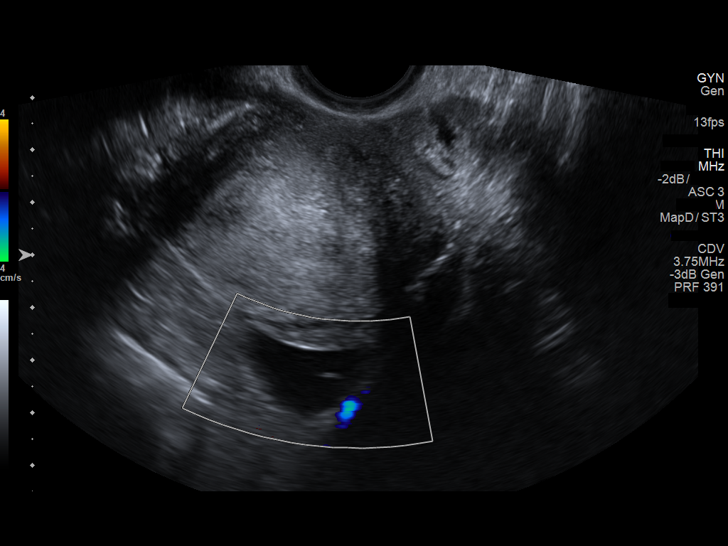
[im 73/80]
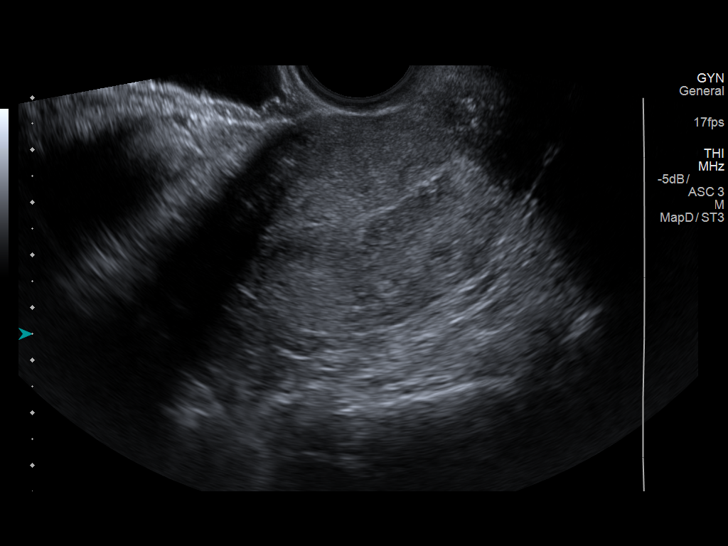
[im 80/80]
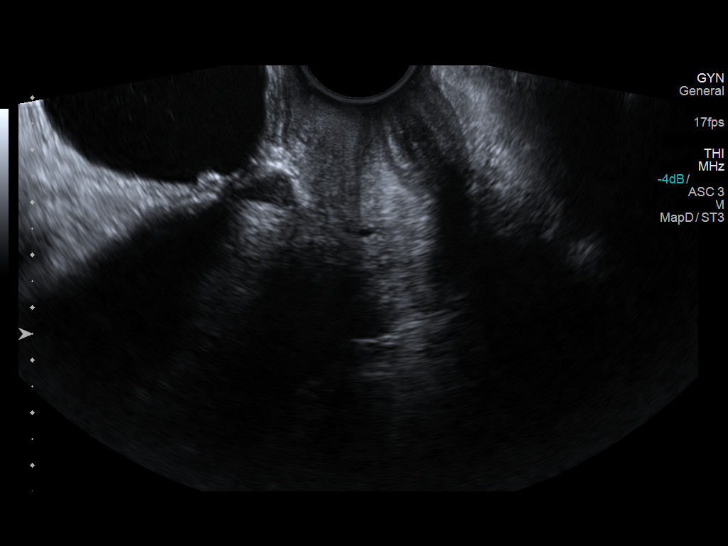

[14 of 25 positions shown; findings below may reference images not displayed]

FINDINGS: Uterus

Measurements: 4.4 x 5.8 x 6.8 cm. No fibroids or other mass
visualized.

Endometrium

Thickness: 17.2 mm.  No focal abnormality visualized.

Right ovary

Measurements: 2.3 x 2.3 x 3.1 cm. Normal appearance/no adnexal mass.
Normal color flow.

Left ovary

Measurements: 2.3 x 3.0 x 2.9 cm. Normal appearance/no adnexal mass.
2.6 cm dominant follicle. Normal color flow.

Other findings

Trace free pelvic fluid.
IMPRESSION: Normal size uterus with slightly thickened endometrium, but no focal
abnormality. Findings could be seen with hyperplasia, polyp or
submucosal fibroid. Consider follow-up ultrasound in 6 weeks and GYN
consultation if this persists.

## 2019-02-06 ENCOUNTER — Other Ambulatory Visit: Payer: Self-pay | Admitting: General Practice

## 2019-02-07 MED ORDER — CRYSELLE-28 0.3-30 MG-MCG PO TABS: 1.0000 | ORAL_TABLET | Freq: Every day | ORAL | 10 refills | Status: DC

## 2020-02-23 ENCOUNTER — Encounter: Payer: Self-pay | Admitting: Family

## 2020-02-23 ENCOUNTER — Telehealth (INDEPENDENT_AMBULATORY_CARE_PROVIDER_SITE_OTHER): Payer: Self-pay | Admitting: Family

## 2020-02-23 VITALS — Ht 63.0 in | Wt 204.0 lb

## 2020-02-23 DIAGNOSIS — Z7689 Persons encountering health services in other specified circumstances: Secondary | ICD-10-CM

## 2020-02-23 DIAGNOSIS — N921 Excessive and frequent menstruation with irregular cycle: Secondary | ICD-10-CM

## 2020-02-23 MED ORDER — CRYSELLE-28 0.3-30 MG-MCG PO TABS: 1.0000 | ORAL_TABLET | Freq: Every day | ORAL | 11 refills | Status: AC

## 2020-02-23 NOTE — Progress Notes (Signed)
Virtual Visit via Telephone Note  I connected with Tamara Hunter, on 02/23/2020 at 11:08 AM by telephone due to the COVID-19 pandemic and verified that I am speaking with the correct person using two identifiers.  Due to current restrictions/limitations of in-office visits due to the COVID-19 pandemic, this scheduled clinical appointment was converted to a telehealth visit.   Consent: I discussed the limitations, risks, security and privacy concerns of performing an evaluation and management service by telephone and the availability of in person appointments. I also discussed with the patient that there may be a patient responsible charge related to this service. The patient expressed understanding and agreed to proceed.   Location of Patient: Home  Location of Provider: [facility]   Persons participating in Telemedicine visit: Jess Peale Ricky Stabs, NP Eloise Levels, RN   History of Present Illness: Tamara Hunter is to establish care. Patient has a PMH significant for dysfunctional uterine bleeding, obesity, symptomatic anemia, syncope, and menometorrhagia.   Current issues and/or concerns: Reports ran out of birth control. Periods are worsening and heavy without birth control requesting refils.   Reports abdominal pain located near middle of abdomen. Feels like her stomach is about to explode especially every morning. Has changed diet, eating healthier, skipping some meals without improvement. Not taking any over-the-counter medications. Stomach pain tolerable for now.  Nausea/Vomiting: no Diarrhea: no  Constipation: no Melena/BRBPR: no Hematemesis: no Fever/Chills: no Dysuria: no  PMH Past Surgeries:  No past medical history on file. No Known Allergies  Current Outpatient Medications on File Prior to Visit  Medication Sig Dispense Refill  . ibuprofen (ADVIL,MOTRIN) 600 MG tablet Take 1 tablet (600 mg total) by mouth every 6 (six) hours as  needed. (Patient not taking: No sig reported) 30 tablet 1   No current facility-administered medications on file prior to visit.    Observations/Objective: Alert and oriented x 3. Not in acute distress. Physical examination not completed as this is a telemedicine visit.  Assessment and Plan: 1. Encounter to establish care: - Patient presents today to establish care.  - Return in 4 to 6 weeks or sooner if needed for annual physical examination, labs, and health maintenance. Arrive fasting meaning having had no food and/or nothing to drink for at least 8 hours prior to appointment.  2. Menometrorrhagia: - Patient reports birth control helps to regulate menstruation cycle and to decrease heavy flow. Requests refill. - Continue Norgestrel-Ethinyl Estradiol as prescribed. - IF YOU EXPERIENCE ANY OF THE FOLLOWING, PLEASE CALL IMMEDIATELY OR GO TO THE EMERGENCY ROOM:  severe abdominal pain or tenderness in the lower abdomen  chest pain, sharp, sudden shortness of breath or coughing up blood  headache, severe and sudden, or vomiting, dizziness or faintness  eyesight problems, such as sudden blurred or doubled vision or flashes of light  severe pain or swelling in calf or groin - Follow-up with primary provider as needed. - norgestrel-ethinyl estradiol (CRYSELLE-28) 0.3-30 MG-MCG tablet; Take 1 tablet by mouth daily.  Dispense: 28 tablet; Refill: 11  Follow Up Instructions: Follow-up in 4 to 6 weeks or sooner if needed for annual physical examination.   Patient was given clear instructions to go to Emergency Department or return to medical center if symptoms don't improve, worsen, or new problems develop.The patient verbalized understanding.  I discussed the assessment and treatment plan with the patient. The patient was provided an opportunity to ask questions and all were answered. The patient agreed with the plan  and demonstrated an understanding of the instructions.   The patient was  advised to call back or seek an in-person evaluation if the symptoms worsen or if the condition fails to improve as anticipated.   I provided 10  minutes total of non-face-to-face time during this encounter including median intraservice time, reviewing previous notes, labs, imaging, medications, management and patient verbalized understanding.    Rema Fendt, NP  [facility], [place] [phone] 02/24/2020, 9:59 PM

## 2020-02-23 NOTE — Progress Notes (Unsigned)
Requesting birth control refills to regulate periods, has very heavy flow if not taking birth control pills

## 2020-03-01 ENCOUNTER — Ambulatory Visit: Payer: Self-pay | Attending: Family

## 2020-03-01 ENCOUNTER — Other Ambulatory Visit: Payer: Self-pay

## 2020-04-19 ENCOUNTER — Encounter: Payer: Self-pay | Admitting: Family

## 2020-04-19 NOTE — Progress Notes (Signed)
Patient did not show for appointment.   

## 2020-11-23 ENCOUNTER — Other Ambulatory Visit: Payer: Self-pay | Admitting: Family

## 2020-11-23 DIAGNOSIS — N921 Excessive and frequent menstruation with irregular cycle: Secondary | ICD-10-CM

## 2022-04-07 ENCOUNTER — Other Ambulatory Visit: Payer: Self-pay

## 2022-04-07 ENCOUNTER — Emergency Department (HOSPITAL_COMMUNITY)
Admission: EM | Admit: 2022-04-07 | Discharge: 2022-04-07 | Disposition: A | Discharge status: Home / Self Care | Payer: Medicaid Other | Attending: Emergency Medicine | Admitting: Emergency Medicine

## 2022-04-07 ENCOUNTER — Emergency Department (HOSPITAL_COMMUNITY): Payer: Medicaid Other

## 2022-04-07 DIAGNOSIS — K8020 Calculus of gallbladder without cholecystitis without obstruction: Secondary | ICD-10-CM

## 2022-04-07 DIAGNOSIS — R1013 Epigastric pain: Secondary | ICD-10-CM | POA: Insufficient documentation

## 2022-04-07 DIAGNOSIS — F439 Reaction to severe stress, unspecified: Secondary | ICD-10-CM | POA: Insufficient documentation

## 2022-04-07 LAB — COMPREHENSIVE METABOLIC PANEL
ALT: 16 U/L (ref 0–44)
AST: 16 U/L (ref 15–41)
Albumin: 3.6 g/dL (ref 3.5–5.0)
Alkaline Phosphatase: 47 U/L (ref 38–126)
Anion gap: 9 (ref 5–15)
BUN: 12 mg/dL (ref 6–20)
CO2: 23 mmol/L (ref 22–32)
Calcium: 9.2 mg/dL (ref 8.9–10.3)
Chloride: 106 mmol/L (ref 98–111)
Creatinine, Ser: 0.92 mg/dL (ref 0.44–1.00)
GFR, Estimated: >60 mL/min (ref >60)
Glucose, Bld: 111 mg/dL — ABNORMAL HIGH (ref 70–99)
Potassium: 3.7 mmol/L (ref 3.5–5.1)
Sodium: 138 mmol/L (ref 135–145)
Total Bilirubin: 0.6 mg/dL (ref 0.3–1.2)
Total Protein: 6.9 g/dL (ref 6.5–8.1)

## 2022-04-07 LAB — URINALYSIS, MICROSCOPIC (REFLEX)

## 2022-04-07 LAB — CBC
HCT: 38.9 % (ref 36.0–46.0)
Hemoglobin: 13.2 g/dL (ref 12.0–15.0)
MCH: 29.7 pg (ref 26.0–34.0)
MCHC: 33.9 g/dL (ref 30.0–36.0)
MCV: 87.6 fL (ref 80.0–100.0)
Platelets: 301 10*3/uL (ref 150–400)
RBC: 4.44 MIL/uL (ref 3.87–5.11)
RDW: 13.1 % (ref 11.5–15.5)
WBC: 9.5 10*3/uL (ref 4.0–10.5)
nRBC: 0 % (ref 0.0–0.2)

## 2022-04-07 LAB — URINALYSIS, ROUTINE W REFLEX MICROSCOPIC
Bilirubin Urine: NEGATIVE
Glucose, UA: NEGATIVE mg/dL
Ketones, ur: NEGATIVE mg/dL
Leukocytes,Ua: NEGATIVE
Nitrite: NEGATIVE
Protein, ur: NEGATIVE mg/dL
Specific Gravity, Urine: >1.03 — ABNORMAL HIGH (ref 1.005–1.030)
pH: 5.5 (ref 5.0–8.0)

## 2022-04-07 LAB — LIPASE, BLOOD: Lipase: 38 U/L (ref 11–51)

## 2022-04-07 LAB — PREGNANCY, URINE: Preg Test, Ur: NEGATIVE

## 2022-04-07 MED ORDER — OMEPRAZOLE 20 MG PO CPDR: 20.0000 mg | DELAYED_RELEASE_CAPSULE | Freq: Every day | ORAL | 0 refills | Status: AC

## 2022-04-07 MED ORDER — SUCRALFATE 1 G PO TABS: 1.0000 g | ORAL_TABLET | Freq: Three times a day (TID) | ORAL | 0 refills | Status: AC

## 2022-04-07 NOTE — ED Provider Notes (Signed)
[facility] Emergency Department Provider Note MRN:  [id number]  Arrival date & time: 04/07/22     Chief Complaint   Abdominal Pain   History of Present Illness   Tamara Hunter is a 32 y.o. year-old female presents to the ED with chief complaint of epigastric abdominal pain for the past 3 weeks.  Worse after eating.  Worse at night when she lays down.  States that she has tried ibuprofen and pepto without much relief.  She doesn't smoke or drink  She does drink caffeine and has been under a lot of stress.  She endorses poor diet.  History provided by patient.   Review of Systems  Pertinent positive and negative review of systems noted in HPI.    Physical Exam   Vitals:   04/07/22 0424 04/07/22 0600  BP: 131/84 103/68  Pulse: 97 71  Resp: 20   Temp: 98 F (36.7 C)   SpO2: 100% 98%    CONSTITUTIONAL:  well-appearing, NAD NEURO:  Alert and oriented x 3, CN 3-12 grossly intact EYES:  eyes equal and reactive ENT/NECK:  Supple, no stridor  CARDIO:  normal rate, regular rhythm, appears well-perfused  PULM:  No respiratory distress, CTAB GI/GU:  non-distended, no focal abdominal tenderness MSK/SPINE:  No gross deformities, no edema, moves all extremities  SKIN:  no rash, atraumatic   *Additional and/or pertinent findings included in MDM below  Diagnostic and Interventional Summary    EKG Interpretation  Date/Time:    Ventricular Rate:    PR Interval:    QRS Duration:   QT Interval:    QTC Calculation:   R Axis:     Text Interpretation:         Labs Reviewed  COMPREHENSIVE METABOLIC PANEL - Abnormal; Notable for the following components:      Result Value   Glucose, Bld 111 (*)    All other components within normal limits  URINALYSIS, ROUTINE W REFLEX MICROSCOPIC - Abnormal; Notable for the following components:   APPearance CLOUDY (*)    Specific Gravity, Urine >1.030 (*)    Hgb urine dipstick MODERATE (*)    All other components  within normal limits  URINALYSIS, MICROSCOPIC (REFLEX) - Abnormal; Notable for the following components:   Bacteria, UA FEW (*)    All other components within normal limits  LIPASE, BLOOD  CBC  PREGNANCY, URINE  I-STAT BETA HCG BLOOD, ED ([facility], [facility], [facility] ONLY)    US Abdomen Limited RUQ (LIVER/GB)    (Results Pending)    Medications - No data to display   Procedures  /  Critical Care Procedures  ED Course and Medical Decision Making  I have reviewed the triage vital signs, the nursing notes, and pertinent available records from the EMR.  Social Determinants Affecting Complexity of Care: Patient has no clinically significant social determinants affecting this chief complaint..   ED Course:    Medical Decision Making Patient here with epigastric pain x 3 weeks.  Worse after eating.  Worse when lying down at night to go to sleep.  Sounds consistent with PUD.  Will check labs and consider RUQ [place] due to sometime having associated back pain.  Labs look good.  [place] without evidence of cholecystitis.   Will treat with omeprazole and carafate.  Recommend outpatient GI follow-up.  We discussed diet and foods/drinks to avoid.  Amount and/or Complexity of Data Reviewed Labs: ordered.    Details: No leukocytosis Normal electrolytes Normal LFTs and lipase,  doubt biliary obstruction UA is abnormal, but no urinary symptoms. Radiology: ordered and independent interpretation performed.    Details: Gallstone present. Normal GB wall, doubt cholecystitis.   Risk Prescription drug management. Decision regarding hospitalization.     Consultants: No consultations were needed in caring for this patient.   Treatment and Plan: I considered admission due to patient's initial presentation, but after considering the examination and diagnostic results, patient will not require admission and can be discharged with outpatient follow-up.    Final Clinical Impressions(s) / ED Diagnoses      ICD-10-CM   1. Epigastric pain  R10.13       ED Discharge Orders          Ordered    sucralfate (CARAFATE) 1 g tablet  3 times daily with meals & bedtime        04/07/22 0620    omeprazole (PRILOSEC) 20 MG capsule  Daily        04/07/22 0620              Discharge Instructions Discussed with and Provided to Patient:   Discharge Instructions   None      Montine Circle, PA-C 04/07/22 [place], Barlow, DO 04/07/22 [phone]

## 2022-04-07 NOTE — ED Triage Notes (Signed)
Patient reports intermittent pain across upper abdomen for 2 weeks with emesis this evening , no diarrhea or fever .

## 2022-04-15 ENCOUNTER — Encounter (HOSPITAL_COMMUNITY): Payer: Self-pay | Admitting: Emergency Medicine

## 2022-04-15 ENCOUNTER — Other Ambulatory Visit: Payer: Self-pay

## 2022-04-15 ENCOUNTER — Emergency Department (HOSPITAL_COMMUNITY): Payer: Self-pay

## 2022-04-15 ENCOUNTER — Emergency Department (HOSPITAL_COMMUNITY)
Admission: EM | Admit: 2022-04-15 | Discharge: 2022-04-15 | Disposition: A | Discharge status: Home / Self Care | Payer: Self-pay | Attending: Emergency Medicine | Admitting: Emergency Medicine

## 2022-04-15 DIAGNOSIS — K8020 Calculus of gallbladder without cholecystitis without obstruction: Secondary | ICD-10-CM | POA: Insufficient documentation

## 2022-04-15 LAB — COMPREHENSIVE METABOLIC PANEL
ALT: 21 U/L (ref 0–44)
AST: 17 U/L (ref 15–41)
Albumin: 3.6 g/dL (ref 3.5–5.0)
Alkaline Phosphatase: 38 U/L (ref 38–126)
Anion gap: 8 (ref 5–15)
BUN: 6 mg/dL (ref 6–20)
CO2: 24 mmol/L (ref 22–32)
Calcium: 9.1 mg/dL (ref 8.9–10.3)
Chloride: 107 mmol/L (ref 98–111)
Creatinine, Ser: 0.82 mg/dL (ref 0.44–1.00)
GFR, Estimated: >60 mL/min (ref >60)
Glucose, Bld: 119 mg/dL — ABNORMAL HIGH (ref 70–99)
Potassium: 3 mmol/L — ABNORMAL LOW (ref 3.5–5.1)
Sodium: 139 mmol/L (ref 135–145)
Total Bilirubin: 0.6 mg/dL (ref 0.3–1.2)
Total Protein: 6.7 g/dL (ref 6.5–8.1)

## 2022-04-15 LAB — CBC
HCT: 36.8 % (ref 36.0–46.0)
Hemoglobin: 12.4 g/dL (ref 12.0–15.0)
MCH: 29.7 pg (ref 26.0–34.0)
MCHC: 33.7 g/dL (ref 30.0–36.0)
MCV: 88.2 fL (ref 80.0–100.0)
Platelets: 336 10*3/uL (ref 150–400)
RBC: 4.17 MIL/uL (ref 3.87–5.11)
RDW: 13.9 % (ref 11.5–15.5)
WBC: 7 10*3/uL (ref 4.0–10.5)
nRBC: 0 % (ref 0.0–0.2)

## 2022-04-15 LAB — I-STAT BETA HCG BLOOD, ED (MC, WL, AP ONLY): I-stat hCG, quantitative: <5 m[IU]/mL (ref <5)

## 2022-04-15 LAB — LIPASE, BLOOD: Lipase: 33 U/L (ref 11–51)

## 2022-04-15 MED ORDER — IOHEXOL 350 MG/ML SOLN
75.0000 mL | Freq: Once | INTRAVENOUS | Status: AC | PRN
  Administered 2022-04-15: 75 mL via INTRAVENOUS

## 2022-04-15 MED ORDER — POTASSIUM CHLORIDE CRYS ER 20 MEQ PO TBCR
40.0000 meq | EXTENDED_RELEASE_TABLET | Freq: Once | ORAL | Status: AC
  Administered 2022-04-15: 40 meq via ORAL
  Filled 2022-04-15: qty 2

## 2022-04-15 MED ORDER — MORPHINE SULFATE (PF) 4 MG/ML IV SOLN
4.0000 mg | Freq: Once | INTRAVENOUS | Status: AC
  Administered 2022-04-15: 4 mg via INTRAVENOUS
  Filled 2022-04-15: qty 1

## 2022-04-15 MED ORDER — ONDANSETRON 4 MG PO TBDP
4.0000 mg | ORAL_TABLET | Freq: Once | ORAL | Status: AC | PRN
  Administered 2022-04-15: 4 mg via ORAL
  Filled 2022-04-15: qty 1

## 2022-04-15 MED ORDER — OXYCODONE-ACETAMINOPHEN 5-325 MG PO TABS: 1.0000 | ORAL_TABLET | ORAL | 0 refills | Status: AC | PRN

## 2022-04-15 NOTE — ED Triage Notes (Signed)
Pt reports upper abdominal pain that has gotten worse over the past week.  She states she has been "eating healthy" and taking the prescribing medication but the pain continues to get worse.  Pt states she has not had a regular bowel movement "in a few days."

## 2022-04-15 NOTE — ED Provider Notes (Signed)
[facility] Emergency Department Provider Note MRN:  [id number]  Arrival date & time: 04/15/22     Chief Complaint   Abdominal Pain   History of Present Illness   Tamara Hunter is a 32 y.o. year-old female presents to the ED with chief complaint of abdominal pain.  Hx of the same.  Seen recently by me for the same and treated with carafate and omeprazole for suspected PUD.  She has had some improvement of her symptoms, but she still reports intermittent RUQ pain.  Denies fever.  History provided by patient.   Review of Systems  Pertinent positive and negative review of systems noted in HPI.    Physical Exam   Vitals:   04/15/22 0215 04/15/22 0315  BP: 119/77 116/76  Pulse: 71 60  Resp: 12 16  Temp:    SpO2: 98% 100%    CONSTITUTIONAL:  well-appearing, NAD NEURO:  Alert and oriented x 3, CN 3-12 grossly intact EYES:  eyes equal and reactive ENT/NECK:  Supple, no stridor  CARDIO:  normal rate, regular rhythm, appears well-perfused  PULM:  No respiratory distress, CTAB GI/GU:  non-distended,  mild RUQ TTP MSK/SPINE:  No gross deformities, no edema, moves all extremities  SKIN:  no rash, atraumatic   *Additional and/or pertinent findings included in MDM below  Diagnostic and Interventional Summary    EKG Interpretation  Date/Time:    Ventricular Rate:    PR Interval:    QRS Duration:   QT Interval:    QTC Calculation:   R Axis:     Text Interpretation:         Labs Reviewed  COMPREHENSIVE METABOLIC PANEL - Abnormal; Notable for the following components:      Result Value   Potassium 3.0 (*)    Glucose, Bld 119 (*)    All other components within normal limits  LIPASE, BLOOD  CBC  URINALYSIS, ROUTINE W REFLEX MICROSCOPIC  I-STAT BETA HCG BLOOD, ED ([facility], [facility], [facility] ONLY)    CT ABDOMEN PELVIS W CONTRAST  Final Result    US Abdomen Limited RUQ (LIVER/GB)  Final Result      Medications  ondansetron (ZOFRAN-ODT) disintegrating  tablet 4 mg (4 mg Oral Given 04/15/22 0122)  morphine (PF) 4 MG/ML injection 4 mg (4 mg Intravenous Given 04/15/22 0216)  potassium chloride SA (KLOR-CON M) CR tablet 40 mEq (40 mEq Oral Given 04/15/22 0312)  iohexol (OMNIPAQUE) 350 MG/ML injection 75 mL (75 mLs Intravenous Contrast Given 04/15/22 0306)     Procedures  /  Critical Care Procedures  ED Course and Medical Decision Making  I have reviewed the triage vital signs, the nursing notes, and pertinent available records from the EMR.  Social Determinants Affecting Complexity of Care: Patient has no clinically significant social determinants affecting this chief complaint..   ED Course:    Medical Decision Making Patient here with upper abdominal pain.  Worse at night.  Not much improvement with carafate and PPI.    Will recheck [place].  May need CT if negative.   CT and [place] are both consistent with cholelithiasis but without cholecystitis. Will plan for follow-up with general surgery.  Will give some percocet.  Return precautions discussed.  Amount and/or Complexity of Data Reviewed Labs: ordered.    Details: K is 3.0, replaced in ED No leukocytosis, doubt infection LFTs are normal, bili normal, doubt biliary obstruction Lipase is normal at 33 Radiology: ordered and independent interpretation performed.    Details:  Gallstone seen on [place]  Risk Prescription drug management. Decision regarding hospitalization.     Consultants: No consultations were needed in caring for this patient.   Treatment and Plan: I considered admission due to patient's initial presentation, but after considering the examination and diagnostic results, patient will not require admission and can be discharged with outpatient follow-up.    Final Clinical Impressions(s) / ED Diagnoses     ICD-10-CM   1. Calculus of gallbladder without cholecystitis without obstruction  K80.20       ED Discharge Orders          Ordered    oxyCODONE-acetaminophen  (PERCOCET) 5-325 MG tablet  Every 4 hours PRN        04/15/22 0347              Discharge Instructions Discussed with and Provided to Patient:   Discharge Instructions   None      Montine Circle, PA-C 04/15/22 0414    Mesner, Corene Cornea, MD 04/15/22 [phone]
